# Patient Record
Sex: Male | Born: 2005 | Race: White | Hispanic: Yes | Marital: Single | Smoking: Never smoker
Health system: Southern US, Community
[De-identification: ages and names within clinical notes are randomized; demographics above are authoritative.]

## PROBLEM LIST (undated history)

## (undated) HISTORY — PX: FRACTURE SURGERY: SHX138

---
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
## 2023-02-15 DIAGNOSIS — S66912A Strain of unspecified muscle, fascia and tendon at wrist and hand level, left hand, initial encounter: Secondary | ICD-10-CM | POA: Diagnosis not present

## 2023-02-15 DIAGNOSIS — S63502A Unspecified sprain of left wrist, initial encounter: Secondary | ICD-10-CM | POA: Diagnosis not present

## 2023-02-15 DIAGNOSIS — M25532 Pain in left wrist: Secondary | ICD-10-CM | POA: Diagnosis not present
